# Patient Record
Sex: Female | Born: 2000
Health system: Southern US, Community
[De-identification: ages and names within clinical notes are randomized; demographics above are authoritative.]

## PROBLEM LIST (undated history)

## (undated) ENCOUNTER — Inpatient Hospital Stay (AMBULATORY_SURGERY_CENTER): Payer: 59 | Admitting: Podiatry

## (undated) DIAGNOSIS — G43109 Migraine with aura, not intractable, without status migrainosus: Secondary | ICD-10-CM

## (undated) DIAGNOSIS — T7840XA Allergy, unspecified, initial encounter: Secondary | ICD-10-CM

## (undated) DIAGNOSIS — D649 Anemia, unspecified: Secondary | ICD-10-CM

## (undated) DIAGNOSIS — N920 Excessive and frequent menstruation with regular cycle: Secondary | ICD-10-CM

## (undated) HISTORY — DX: Excessive and frequent menstruation with regular cycle: N92.0

## (undated) HISTORY — DX: Migraine with aura, not intractable, without status migrainosus: G43.109

## (undated) HISTORY — DX: Allergy, unspecified, initial encounter: T78.40XA

## (undated) HISTORY — DX: Anemia, unspecified: D64.9

---
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
## 2021-06-21 DIAGNOSIS — F419 Anxiety disorder, unspecified: Secondary | ICD-10-CM | POA: Diagnosis not present

## 2021-06-21 DIAGNOSIS — N946 Dysmenorrhea, unspecified: Secondary | ICD-10-CM | POA: Diagnosis not present

## 2021-08-09 DIAGNOSIS — Z68.41 Body mass index (BMI) pediatric, 5th percentile to less than 85th percentile for age: Secondary | ICD-10-CM | POA: Diagnosis not present

## 2021-08-09 DIAGNOSIS — Z01419 Encounter for gynecological examination (general) (routine) without abnormal findings: Secondary | ICD-10-CM | POA: Diagnosis not present

## 2021-12-13 NOTE — Progress Notes (Signed)
?FOLLOW UP ? ?Assessment and Plan:  ? ?Jody Wyatt was seen today for CPE ? ?Diagnoses and all orders for this visit: ? ?Encounter for Annual Physical Exam with abnormal findings ?Due annually  ?Health Maintenance reviewed ?Healthy lifestyle reviewed and goals set ? ?Health Maintenance- Discussed STD testing, safe sex, alcohol and drug awareness, drinking and driving dangers, wearing a seat belt and general safety measures for young adult ? ?History of menorrhagia ?Recently off of HBC; monitor ?NSAID 1-2 days prior to cycle ? ?Iron deficiency anemia, unspecified iron deficiency anemia type ?-     CBC with Differential/Platelet ?-     Iron/TIBC/Ferritin - defer, add on if needed ? ?Family history of celiac disease ?Monitor; encouraged low gluten diet due to possible sensitivity ?Check celiac panels occasionally; discussed gold standard is colonoscopy with biopsy if progressive concerning sx; avoid trigger foods ? - recheck panel today per patient preference  ? ?BMI 26  ?Discussion about weight loss, diet, and exercise ?Recommended diet heavy in fruits and veggies and low in animal meats, cheeses, and dairy products, appropriate calorie intake ?Discussed appropriate weight for height  ?Follow up at next visit ?May be due to muscle mass; suggested monitor fat % if aggressively working out ? ?Need for tetanus booster ?- Td administered without complication.  ? ?Elevated LFTs ?- was trending up last year, never returned for recheck ?- denies tylenol, alcohol, family hx ?- recheck LFTs, plan hepatitis/autoimmune workup and liver US for elevations without clear etiology ?- following low processed carb diet ? ?Migraines ?Failed ibuprofen 800 mg; discussed and given imitrex abortive to try, follow up if lack of benefit ?Discussed migraine triggers, encouraged HA log, handout given ?Check magnesium  ?Lifestyle discussed: diet/exerise, sleep hygiene, stress management, hydration ? ?Vitamin D def ?Never started supplement; defer  recheck this year ?Advised to start 2000 IU daily, consider combo with K2 ? ?Orders Placed This Encounter  ?Procedures  ? Td : Tetanus/diphtheria >7yo Preservative  Wyatt  ? CBC with Differential/Platelet  ? COMPLETE METABOLIC PANEL WITH GFR  ? Magnesium  ? Insulin, random  ? Urinalysis, Routine w reflex microscopic  ? Celiac Disease Comprehensive Panel with Reflexes  ? ? ? ?Continue diet and meds as discussed. Further disposition pending results of labs. Discussed med's effects and SE's.   ?Over 30 minutes of exam, counseling, chart review, and critical decision making was performed.  ? ?Future Appointments  ?Date Time Provider [place]  ?12/18/2022  9:00 AM Darrol Jump, NP [facility] None  ? ? ?---------------------------------------------------------------------------------------------------------------------- ? ?HPI ?BP 102/70   Pulse 88   Temp 97.7 ?F (36.5 ?C)   Ht 5' 3.25" (1.607 m)   Wt 150 lb 9.6 oz (68.3 kg)   SpO2 99%   BMI 26.47 kg/m?  ? ?21 y.o. female  presents for CPE. She has Family history of celiac disease; Iron deficiency anemia; BMI 25.0-25.9,adult; History of menorrhagia; Vitamin D deficiency; Elevated LFTs; and Migraine without aura and without status migrainosus, not intractable on their problem list.  ? 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 Living at home with parents. Has boyfriend of 2 years, not sexually active, he is supportive. Denies STD testing.  ? ?She was seen by GYN Jody Chad, NP Jody Wyatt) had workup in 2020 with iron def anemia, hx of menorrhagia, was given iron supplement, menorrhagia improved on HBC, has since tapered off and does report increased cramping, but managing with ibuprofen 800 mg PRN. ? ?She reports having occasional migraines, 2 per year, unknown  trigger, typically on L with photosensitivity. Doesn't respond to ibuprofen 800 mg.  ? ?Notably mom has Celiac's, no gluten at mom's house, but else where may eat some, has noted abdominal pain with  excess gluten, also some with dairy. Reports has a negative celiac panel x 2 in the past. Requests recheck today, having more HA and bloating.  ? ?BMI is Body mass index is 26.47 kg/m?., she has been working on diet and exercise. She is using pelaton 4-5 days a week and strength classes 3 days a week. Eats "clean," meat twice a week (chicken), mostly plant based, less processed.  ?1 cup coffee daily, occasionally a cold brew  ?Water intake 60-90+ fluid ounces of water ?No sugary drinks ?Wt Readings from Last 3 Encounters:  ?12/17/21 150 lb 9.6 oz (68.3 kg) (81 %, Z= 0.86)*  ?12/17/20 149 lb (67.6 kg) (82 %, Z= 0.90)*  ?06/13/20 143 lb 6.4 oz (65 kg) (78 %, Z= 0.77)*  ? ?* Growth percentiles are based on CDC (Girls, 2-20 Years) data.  ? ?She takes iron supplement with menstrual cycle only, hx of menorrhagia.  ? ?Lab Results  ?Component Value Date  ? WBC 4.5 12/17/2020  ? HGB 13.2 12/17/2020  ? HCT 41.3 12/17/2020  ? MCV 88.4 12/17/2020  ? PLT 321 12/17/2020  ? ?Lab Results  ?Component Value Date  ? IRON 97 12/17/2020  ? TIBC 313 12/17/2020  ? FERRITIN 22 12/17/2020  ? ?Today their BP is BP: 102/70 ? She does workout. She denies chest pain, shortness of breath, dizziness. ? ?She admits hasn't started on supplement.  ?Lab Results  ?Component Value Date  ? VD25OH 23 (L) 12/17/2020  ?   ?She had mildly elevated LFTs, trended up from previous, never returned for recheck. Denies excess tylenol use, denies alcohol intake, denies family hx of liver dz.  ?Lab Results  ?Component Value Date  ? ALT 50 (H) 12/17/2020  ? AST 36 (H) 12/17/2020  ? ALKPHOS 53 08/31/2017  ? BILITOT 1.0 12/17/2020  ? ? ? ? ?Current Medications:  ?Current Outpatient Medications on File Prior to Visit  ?Medication Sig  ? busPIRone (BUSPAR) 5 MG tablet Take 5 mg by mouth as needed.  ? CHOLECALCIFEROL PO Take 2,000 Units by mouth daily.  ? Ferrous Sulfate (IRON PO) Take by mouth daily.  ? ibuprofen (ADVIL,MOTRIN) 200 MG tablet Take 400 mg by mouth every 6  (six) hours as needed (foot pain).  ? ?No current facility-administered medications on file prior to visit.  ? ? ? ? ?Immunization History  ?Administered Date(s) Administered  ? DTaP 04/13/2002, 06/10/2002, 08/11/2002, 05/18/2003, 02/12/2007  ? Hepatitis A 03/19/2006, 02/12/2007  ? Hepatitis B 2001/06[birth year], 03/15/2002, 11/09/2002  ? IPV 04/13/2002, 06/10/2002, 11/09/2002, 02/12/2007  ? Influenza Inj Mdck Quad With Preservative 06/13/2020  ? MMR 02/14/2003, 02/12/2007  ? MenQuadfi_Meningococcal Groups ACYW Conjugate 03/18/2013, 05/11/2018  ? PFIZER Comirnaty(Gray Top)Covid-19 Tri-Sucrose Vaccine 01/11/2020, 02/01/2020  ? Pneumococcal Conjugate-13 04/13/2002, 06/10/2002, 08/11/2002, 05/18/2003  ? Td 12/17/2021  ? Tdap 03/17/2012  ? Varicella 05/18/2003, 02/12/2007  ? ?Health Maintenance  ?Topic Date Due  ? HPV VACCINES (1 - 2-dose series) Never done  ? HIV Screening  12/17/2021 (Originally 02/08/2017)  ? COVID-19 Vaccine (3 - Pfizer risk series) 01/02/2022 (Originally 02/29/2020)  ? INFLUENZA VACCINE  03/25/2022  ? TETANUS/TDAP  12/18/2031  ? Hepatitis C Screening  Discontinued  ? ?Tetanus: 2013 TODAY  ?Covid 19: 2/2, pfizer, 2021 - had asymptomatic case 08/2020 ? ?HPV vaccines -  had  as a teen, records reqeust  ? ?Last PAP: by GYN ? 2020, negative, recommend restart age 39 ?Mammogram: never,  ? ?Last eye: denies concerns, never ?Last dental: goes q31m last 2023 ?Last derm: plans to schedule ? ? ? ?Allergies:  ?Allergies  ?Allergen Reactions  ? Augmentin [Amoxicillin-Pot Clavulanate] Rash  ? Percocet [Oxycodone-Acetaminophen] Rash  ?  ?Medical History:  ?has Family history of celiac disease; Iron deficiency anemia; BMI 25.0-25.9,adult; History of menorrhagia; Vitamin D deficiency; Elevated LFTs; and Migraine without aura and without status migrainosus, not intractable on their problem list. ?Surgical History:  ?She  has a past surgical history that includes lapidus bunionectomy (Right, 08/19/2017) and Wisdom tooth  extraction (2020). ?Family History:  ?Herfamily history includes Alcoholism in her father, maternal grandfather, and paternal grandfather; Celiac disease in her mother; Heart disease in her maternal grandfather and

## 2021-12-17 ENCOUNTER — Ambulatory Visit (INDEPENDENT_AMBULATORY_CARE_PROVIDER_SITE_OTHER): Payer: BC Managed Care – PPO | Admitting: Adult Health

## 2021-12-17 ENCOUNTER — Encounter: Payer: Self-pay | Admitting: Adult Health

## 2021-12-17 VITALS — BP 102/70 | HR 88 | Temp 97.7°F | Ht 63.25 in | Wt 150.6 lb

## 2021-12-17 DIAGNOSIS — Z0001 Encounter for general adult medical examination with abnormal findings: Secondary | ICD-10-CM

## 2021-12-17 DIAGNOSIS — R140 Abdominal distension (gaseous): Secondary | ICD-10-CM

## 2021-12-17 DIAGNOSIS — Z131 Encounter for screening for diabetes mellitus: Secondary | ICD-10-CM | POA: Diagnosis not present

## 2021-12-17 DIAGNOSIS — E663 Overweight: Secondary | ICD-10-CM

## 2021-12-17 DIAGNOSIS — Z13228 Encounter for screening for other metabolic disorders: Secondary | ICD-10-CM

## 2021-12-17 DIAGNOSIS — Z8742 Personal history of other diseases of the female genital tract: Secondary | ICD-10-CM

## 2021-12-17 DIAGNOSIS — E559 Vitamin D deficiency, unspecified: Secondary | ICD-10-CM

## 2021-12-17 DIAGNOSIS — Z13220 Encounter for screening for lipoid disorders: Secondary | ICD-10-CM | POA: Diagnosis not present

## 2021-12-17 DIAGNOSIS — Z79899 Other long term (current) drug therapy: Secondary | ICD-10-CM | POA: Diagnosis not present

## 2021-12-17 DIAGNOSIS — Z0000 Encounter for general adult medical examination without abnormal findings: Secondary | ICD-10-CM | POA: Diagnosis not present

## 2021-12-17 DIAGNOSIS — Z23 Encounter for immunization: Secondary | ICD-10-CM

## 2021-12-17 DIAGNOSIS — Z1389 Encounter for screening for other disorder: Secondary | ICD-10-CM | POA: Diagnosis not present

## 2021-12-17 DIAGNOSIS — Z8379 Family history of other diseases of the digestive system: Secondary | ICD-10-CM

## 2021-12-17 DIAGNOSIS — Z6825 Body mass index (BMI) 25.0-25.9, adult: Secondary | ICD-10-CM

## 2021-12-17 DIAGNOSIS — G43009 Migraine without aura, not intractable, without status migrainosus: Secondary | ICD-10-CM | POA: Insufficient documentation

## 2021-12-17 DIAGNOSIS — D509 Iron deficiency anemia, unspecified: Secondary | ICD-10-CM

## 2021-12-17 DIAGNOSIS — R7989 Other specified abnormal findings of blood chemistry: Secondary | ICD-10-CM

## 2021-12-17 MED ORDER — SUMATRIPTAN SUCCINATE 50 MG PO TABS: 50.0000 mg | ORAL_TABLET | Freq: Once | ORAL | 0 refills | Status: DC | PRN

## 2021-12-17 NOTE — Patient Instructions (Addendum)
?Jody Wyatt , ?Thank you for taking time to come for your Annual Wellness Visit. I appreciate your ongoing commitment to your health goals. Please review the following plan we discussed and let me know if I can assist you in the future.  ? ?This is a list of the screening recommended for you and due dates:  ?Health Maintenance  ?Topic Date Due  ? HPV Vaccine (1 - 2-dose series) Never done  ? HIV Screening  12/17/2021*  ? COVID-19 Vaccine (3 - Pfizer risk series) 01/02/2022*  ? Tetanus Vaccine  03/17/2022  ? Flu Shot  03/25/2022  ? Hepatitis C Screening: USPSTF Recommendation to screen - Ages 81-79 yo.  Discontinued  ?*Topic was postponed. The date shown is not the original due date.  ? ?Know what a healthy weight is for you (roughly BMI <25) and aim to maintain this ? ?Aim for 7+ servings of fruits and vegetables daily ? ?65-80+ fluid ounces of water or unsweet tea for healthy kidneys ? ?Limit to max 1 drink of alcohol per day; avoid smoking/tobacco ? ?Limit animal fats in diet for cholesterol and heart health - choose grass fed whenever available ? ?Avoid highly processed foods, and foods high in saturated/trans fats ? ?Aim for low stress - take time to unwind and care for your mental health ? ?Aim for 150 min of moderate intensity exercise weekly for heart health, and weights twice weekly for bone health ? ?Aim for 7-9 hours of sleep daily ? ? ? ? ? ?Migraine Headache ?A migraine headache is an intense, throbbing pain on one side or both sides of the head. Migraine headaches may also cause other symptoms, such as nausea, vomiting, and sensitivity to light and noise. A migraine headache can last from 4 hours to 3 days. Talk with your doctor about what things may bring on (trigger) your migraine headaches. ?What are the causes? ?The exact cause of this condition is not known. However, a migraine may be caused when nerves in the brain become irritated and release chemicals that cause inflammation of blood vessels.  This inflammation causes pain. This condition may be triggered or caused by: ?Drinking alcohol. ?Smoking. ?Taking medicines, such as: ?Medicine used to treat chest pain (nitroglycerin). ?Birth control pills. ?Estrogen. ?Certain blood pressure medicines. ?Eating or drinking products that contain nitrates, glutamate, aspartame, or tyramine. Aged cheeses, chocolate, or caffeine may also be triggers. ?Doing physical activity. ?Other things that may trigger a migraine headache include: ?Menstruation. ?Pregnancy. ?Hunger. ?Stress. ?Lack of sleep or too much sleep. ?Weather changes. ?Fatigue. ?What increases the risk? ?The following factors may make you more likely to experience migraine headaches: ?Being a certain age. This condition is more common in people who are 19-85 years old. ?Being female. ?Having a family history of migraine headaches. ?Being Caucasian. ?Having a mental health condition, such as depression or anxiety. ?Being obese. ?What are the signs or symptoms? ?The main symptom of this condition is pulsating or throbbing pain. This pain may: ?Happen in any area of the head, such as on one side or both sides. ?Interfere with daily activities. ?Get worse with physical activity. ?Get worse with exposure to bright lights or loud noises. ?Other symptoms may include: ?Nausea. ?Vomiting. ?Dizziness. ?General sensitivity to bright lights, loud noises, or smells. ?Before you get a migraine headache, you may get warning signs (an aura). An aura may include: ?Seeing flashing lights or having blind spots. ?Seeing bright spots, halos, or zigzag lines. ?Having tunnel vision or blurred vision. ?Having  numbness or a tingling feeling. ?Having trouble talking. ?Having muscle weakness. ?Some people have symptoms after a migraine headache (postdromal phase), such as: ?Feeling tired. ?Difficulty concentrating. ?How is this diagnosed? ?A migraine headache can be diagnosed based on: ?Your symptoms. ?A physical exam. ?Tests, such  as: ?CT scan or an MRI of the head. These imaging tests can help rule out other causes of headaches. ?Taking fluid from the spine (lumbar puncture) and analyzing it (cerebrospinal fluid analysis, or CSF analysis). ?How is this treated? ?This condition may be treated with medicines that: ?Relieve pain. ?Relieve nausea. ?Prevent migraine headaches. ?Treatment for this condition may also include: ?Acupuncture. ?Lifestyle changes like avoiding foods that trigger migraine headaches. ?Biofeedback. ?Cognitive behavioral therapy. ?Follow these instructions at home: ?Medicines ?Take over-the-counter and prescription medicines only as told by your health care provider. ?Ask your health care provider if the medicine prescribed to you: ?Requires you to avoid driving or using heavy machinery. ?Can cause constipation. You may need to take these actions to prevent or treat constipation: ?Drink enough fluid to keep your urine pale yellow. ?Take over-the-counter or prescription medicines. ?Eat foods that are high in fiber, such as beans, whole grains, and fresh fruits and vegetables. ?Limit foods that are high in fat and processed sugars, such as fried or sweet foods. ?Lifestyle ?Do not drink alcohol. ?Do not use any products that contain nicotine or tobacco, such as cigarettes, e-cigarettes, and chewing tobacco. If you need help quitting, ask your health care provider. ?Get at least 8 hours of sleep every night. ?Find ways to manage stress, such as meditation, deep breathing, or yoga. ?General instructions ? ?  ? ?Keep a journal to find out what may trigger your migraine headaches. For example, write down: ?What you eat and drink. ?How much sleep you get. ?Any change to your diet or medicines. ?If you have a migraine headache: ?Avoid things that make your symptoms worse, such as bright lights. ?It may help to lie down in a dark, quiet room. ?Do not drive or use heavy machinery. ?Ask your health care provider what activities are  safe for you while you are experiencing symptoms. ?Keep all follow-up visits as told by your health care provider. This is important. ?Contact a health care provider if: ?You develop symptoms that are different or more severe than your usual migraine headache symptoms. ?You have more than 15 headache days in one month. ?Get help right away if: ?Your migraine headache becomes severe. ?Your migraine headache lasts longer than 72 hours. ?You have a fever. ?You have a stiff neck. ?You have vision loss. ?Your muscles feel weak or like you cannot control them. ?You start to lose your balance often. ?You have trouble walking. ?You faint. ?You have a seizure. ?Summary ?A migraine headache is an intense, throbbing pain on one side or both sides of the head. Migraines may also cause other symptoms, such as nausea, vomiting, and sensitivity to light and noise. ?This condition may be treated with medicines and lifestyle changes. You may also need to avoid certain things that trigger a migraine headache. ?Keep a journal to find out what may trigger your migraine headaches. ?Contact your health care provider if you have more than 15 headache days in a month or you develop symptoms that are different or more severe than your usual migraine headache symptoms. ?This information is not intended to replace advice given to you by your health care provider. Make sure you discuss any questions you have with your health care  provider. ?Document Revised: 12/03/2018 Document Reviewed: 09/23/2018 ?Elsevier Patient Education ? 2023 Else[employer] Inc. ? ? ? ? ? ?Sumatriptan Tablets ?What is this medication? ?SUMATRIPTAN (soo ma TRIP tan) treats migraines. It works by blocking pain signals and narrowing blood vessels in the brain. It belongs to a group of medications called triptans. It is not used to prevent migraines. ?This medicine may be used for other purposes; ask your health care provider or pharmacist if you have questions. ?COMMON BRAND  NAME(S): Imitrex, Migraine Pack ?What should I tell my care team before I take this medication? ?They need to know if you have any of these conditions: ?Cigarette smoker ?Circulation problems in fingers and toes

## 2021-12-18 LAB — CBC WITH DIFFERENTIAL/PLATELET
Absolute Monocytes: 353 cells/uL (ref 200–950)
Basophils Absolute: 20 cells/uL (ref 0–200)
Basophils Relative: 0.4 %
Eosinophils Absolute: 39 cells/uL (ref 15–500)
Eosinophils Relative: 0.8 %
HCT: 42.9 % (ref 35.0–45.0)
Hemoglobin: 13.9 g/dL (ref 11.7–15.5)
Lymphs Abs: 1318 cells/uL (ref 850–3900)
MCH: 29.1 pg (ref 27.0–33.0)
MCHC: 32.4 g/dL (ref 32.0–36.0)
MCV: 89.7 fL (ref 80.0–100.0)
MPV: 11.4 fL (ref 7.5–12.5)
Monocytes Relative: 7.2 %
Neutro Abs: 3170 cells/uL (ref 1500–7800)
Neutrophils Relative %: 64.7 %
Platelets: 332 10*3/uL (ref 140–400)
RBC: 4.78 10*6/uL (ref 3.80–5.10)
RDW: 11.9 % (ref 11.0–15.0)
Total Lymphocyte: 26.9 %
WBC: 4.9 10*3/uL (ref 3.8–10.8)

## 2021-12-18 LAB — INSULIN, RANDOM: Insulin: 15.5 u[IU]/mL

## 2021-12-18 LAB — URINALYSIS, ROUTINE W REFLEX MICROSCOPIC
Bilirubin Urine: NEGATIVE
Glucose, UA: NEGATIVE
Hgb urine dipstick: NEGATIVE
Leukocytes,Ua: NEGATIVE
Nitrite: NEGATIVE
Protein, ur: NEGATIVE
Specific Gravity, Urine: 1.031 (ref 1.001–1.035)
pH: 6 (ref 5.0–8.0)

## 2021-12-18 LAB — COMPLETE METABOLIC PANEL WITH GFR
AG Ratio: 1.7 (calc) (ref 1.0–2.5)
ALT: 21 U/L (ref 5–32)
AST: 16 U/L (ref 12–32)
Albumin: 5 g/dL (ref 3.6–5.1)
Alkaline phosphatase (APISO): 46 U/L (ref 36–128)
BUN: 13 mg/dL (ref 7–20)
CO2: 28 mmol/L (ref 20–32)
Calcium: 10.4 mg/dL (ref 8.9–10.4)
Chloride: 102 mmol/L (ref 98–110)
Creat: 0.66 mg/dL (ref 0.50–0.96)
Globulin: 2.9 g/dL (calc) (ref 2.0–3.8)
Glucose, Bld: 71 mg/dL (ref 65–99)
Potassium: 4.8 mmol/L (ref 3.8–5.1)
Sodium: 139 mmol/L (ref 135–146)
Total Bilirubin: 1 mg/dL (ref 0.2–1.1)
Total Protein: 7.9 g/dL (ref 6.3–8.2)
eGFR: 130 mL/min/{1.73_m2} (ref >=60)

## 2021-12-18 LAB — MAGNESIUM: Magnesium: 2.1 mg/dL (ref 1.5–2.5)

## 2021-12-18 LAB — CELIAC DISEASE COMPREHENSIVE PANEL WITH REFLEXES
(tTG) Ab, IgA: <1 U/mL
Immunoglobulin A: 233 mg/dL (ref 47–310)

## 2021-12-30 DIAGNOSIS — L723 Sebaceous cyst: Secondary | ICD-10-CM | POA: Diagnosis not present

## 2021-12-30 DIAGNOSIS — L700 Acne vulgaris: Secondary | ICD-10-CM | POA: Diagnosis not present

## 2022-01-13 ENCOUNTER — Other Ambulatory Visit: Payer: Self-pay | Admitting: Adult Health

## 2022-01-13 DIAGNOSIS — G43009 Migraine without aura, not intractable, without status migrainosus: Secondary | ICD-10-CM

## 2022-01-14 ENCOUNTER — Ambulatory Visit (INDEPENDENT_AMBULATORY_CARE_PROVIDER_SITE_OTHER): Payer: BC Managed Care – PPO | Admitting: Plastic Surgery

## 2022-01-14 ENCOUNTER — Encounter: Payer: Self-pay | Admitting: Plastic Surgery

## 2022-01-14 DIAGNOSIS — L723 Sebaceous cyst: Secondary | ICD-10-CM | POA: Diagnosis not present

## 2022-01-14 NOTE — Progress Notes (Signed)
     Patient ID: Jody Wyatt, female    DOB: 01-02-2001, 21 y.o.   MRN: [id number]   Chief Complaint  Patient presents with   Consult        Skin Problem    The patient is a 21 year old female here for evaluation of a lesion on her face.  Patient states that that has been there for several months.  It seems to be getting a little bit larger.  She has been really good about not messing with it so that it does not get red and irritated.  It is slightly firm and about 6 mm and has the consistency of a sebaceous cyst.  She is otherwise in good health.    Review of Systems  Constitutional: Negative.   Eyes: Negative.   Respiratory: Negative.    Cardiovascular: Negative.   Gastrointestinal: Negative.   Endocrine: Negative.   Genitourinary: Negative.   Musculoskeletal: Negative.   Skin:  Negative for rash and wound.   Past Medical History:  Diagnosis Date   Menorrhagia     Past Surgical History:  Procedure Laterality Date   lapidus bunionectomy Right 08/19/2017   WISDOM TOOTH EXTRACTION  2020      Current Outpatient Medications:    busPIRone (BUSPAR) 5 MG tablet, Take 5 mg by mouth as needed., Disp: , Rfl:    CHOLECALCIFEROL PO, Take 2,000 Units by mouth daily., Disp: , Rfl:    Ferrous Sulfate (IRON PO), Take by mouth daily., Disp: , Rfl:    ibuprofen (ADVIL,MOTRIN) 200 MG tablet, Take 400 mg by mouth every 6 (six) hours as needed (foot pain)., Disp: , Rfl:    SUMAtriptan (IMITREX) 50 MG tablet, TAKE 1 TAB BY MOUTH AS NEEDED FOR MIGRAINE, MAY REPEAT IN 2 HOURS IF PERSISTS, Disp: 10 tablet, Rfl: 0   Objective:   Vitals:   01/14/22 1511  BP: 109/67  Pulse: 65  SpO2: 98%    Physical Exam Constitutional:      Appearance: Normal appearance.  HENT:     Head:   Cardiovascular:     Rate and Rhythm: Normal rate.     Pulses: Normal pulses.  Pulmonary:     Effort: Pulmonary effort is normal.  Skin:    General: Skin is warm.     Capillary Refill: Capillary refill  takes less than 2 seconds.     Coloration: Skin is not jaundiced.     Findings: Lesion present. No bruising.  Neurological:     Mental Status: She is alert and oriented to person, place, and time.  Psychiatric:        Mood and Affect: Mood normal.        Behavior: Behavior normal.    Assessment & Plan:  Sebaceous cyst  Plan excision of sebaceous cyst chin  Loel Lofty Kimoni Pagliarulo, DO

## 2022-01-29 DIAGNOSIS — J019 Acute sinusitis, unspecified: Secondary | ICD-10-CM | POA: Diagnosis not present

## 2022-03-13 ENCOUNTER — Ambulatory Visit: Payer: Self-pay | Admitting: Podiatry

## 2022-03-28 ENCOUNTER — Ambulatory Visit (INDEPENDENT_AMBULATORY_CARE_PROVIDER_SITE_OTHER): Payer: BC Managed Care – PPO | Admitting: Plastic Surgery

## 2022-03-28 ENCOUNTER — Encounter: Payer: Self-pay | Admitting: Plastic Surgery

## 2022-03-28 VITALS — BP 116/79 | HR 73

## 2022-03-28 DIAGNOSIS — L723 Sebaceous cyst: Secondary | ICD-10-CM

## 2022-03-28 NOTE — Progress Notes (Signed)
Procedure Note  Preoperative Dx: sebaceous cyst of chin  Postoperative Dx: Same  Procedure: Excision of sebaceous cyst of chin 5 mm  Anesthesia: Lidocaine 1% with 1:100,000 epinephrine  Indication for Procedure: cyst  Description of Procedure: Risks and complications were explained to the patient.  Consent was confirmed and the patient understands the risks and benefits.  The potential complications and alternatives were explained and the patient consents.  The patient expressed understanding the option of not having the procedure and the risks of a scar.  Time out was called and all information was confirmed to be correct.    The area was prepped and drapped.  Lidocaine 1% with epinepherine was injected in the subcutaneous area.  After waiting several minutes for the local to take affect a #15 blade was used to incise the skin over the area.  The #15 blade was used to excise the entire sebaceous cyst and sac. The skin edges were reapproximated with 6-0 Monocryl.  A dressing was applied.  The patient was given instructions on how to care for the area and a follow up appointment.  Joelene tolerated the procedure well and there were no complications. Agreed that path not needed.

## 2022-04-07 ENCOUNTER — Ambulatory Visit (INDEPENDENT_AMBULATORY_CARE_PROVIDER_SITE_OTHER): Payer: BC Managed Care – PPO | Admitting: Physician Assistant

## 2022-04-07 DIAGNOSIS — L989 Disorder of the skin and subcutaneous tissue, unspecified: Secondary | ICD-10-CM

## 2022-04-07 NOTE — Progress Notes (Signed)
Patient is a 21 year old female with PMH of facial lesion s/p excision performed 03/28/2022 by Dr. Ulice Bold presents to clinic for postprocedural follow-up.  Reviewed procedural note and the lesion was excised and then reapproximated and closed with 6-0 Monocryl.  No complications.  The specimen was not sent for pathology, but had a consistency of a sebaceous cyst.  Today, patient is doing well.  She states that she replaced the Steri-Strip at home after the one that was initially placed started to fall off and was permeated with bloody drainage.  Otherwise, no complaints.  Steri-Strip is removed and the excision site appears to be well approximated.  Two Monocryl skin stitches are removed without complication or difficulty.  No bleeding provoked.  No surrounding skin changes or concern for cellulitis.  She states that her sister had a lesion excised and subsequently purchased a silicone scar gel from this clinic.  Patient plans to use that scar gel for her scar mitigation.  Recommend twice daily x3 months.  No specific follow-up needed, but she can certainly call the clinic should she have any questions or concerns.

## 2022-04-14 DIAGNOSIS — L700 Acne vulgaris: Secondary | ICD-10-CM | POA: Diagnosis not present

## 2022-07-15 DIAGNOSIS — L700 Acne vulgaris: Secondary | ICD-10-CM | POA: Diagnosis not present

## 2022-11-23 ENCOUNTER — Emergency Department (HOSPITAL_BASED_OUTPATIENT_CLINIC_OR_DEPARTMENT_OTHER): Payer: BC Managed Care – PPO | Admitting: Radiology

## 2022-11-23 ENCOUNTER — Encounter (HOSPITAL_BASED_OUTPATIENT_CLINIC_OR_DEPARTMENT_OTHER): Payer: Self-pay

## 2022-11-23 ENCOUNTER — Emergency Department (HOSPITAL_BASED_OUTPATIENT_CLINIC_OR_DEPARTMENT_OTHER)
Admission: EM | Admit: 2022-11-23 | Discharge: 2022-11-23 | Disposition: A | Discharge status: Home / Self Care | Payer: BC Managed Care – PPO | Attending: Emergency Medicine | Admitting: Emergency Medicine

## 2022-11-23 DIAGNOSIS — Y9301 Activity, walking, marching and hiking: Secondary | ICD-10-CM | POA: Diagnosis not present

## 2022-11-23 DIAGNOSIS — X500XXA Overexertion from strenuous movement or load, initial encounter: Secondary | ICD-10-CM | POA: Diagnosis not present

## 2022-11-23 DIAGNOSIS — S92352A Displaced fracture of fifth metatarsal bone, left foot, initial encounter for closed fracture: Secondary | ICD-10-CM | POA: Diagnosis not present

## 2022-11-23 DIAGNOSIS — S99922A Unspecified injury of left foot, initial encounter: Secondary | ICD-10-CM | POA: Diagnosis not present

## 2022-11-23 NOTE — ED Provider Notes (Signed)
[facility] Provider Note   CSN: [id number] Arrival date & time: 11/23/22  [id number]     History  Chief Complaint  Patient presents with   Foot Injury    Jody Wyatt is a 22 y.o. female.   Foot Injury   22 year old female presents emergency department with complaints of left foot injury.  Patient states that she was attempting to walk her dog when she stepped on an object in the yard rolling her left foot.  She states that initially close pain and heard a "pop" but was able to weight-bear thereafter.  States he woke up this morning and the swelling and pain was more severe and she is unable to weight-bear prompting visit to the emergency department.  Denies fall from injury or pain elsewhere.  Denies any weakness or sensory deficits in affected foot.  Patient with prior bunionectomy of left foot.  No significant pertinent past medical history. Home Medications Prior to Admission medications   Medication Sig Start Date End Date Taking? Authorizing Provider  busPIRone (BUSPAR) 5 MG tablet Take 5 mg by mouth as needed.    [provider]  CHOLECALCIFEROL PO Take 2,000 Units by mouth daily.    [provider]  Ferrous Sulfate (IRON PO) Take by mouth daily.    [provider]  ibuprofen (ADVIL,MOTRIN) 200 MG tablet Take 400 mg by mouth every 6 (six) hours as needed (foot pain).    [provider]  SUMAtriptan (IMITREX) 50 MG tablet TAKE 1 TAB BY MOUTH AS NEEDED FOR MIGRAINE, MAY REPEAT IN 2 HOURS IF PERSISTS 01/13/22   Alycia Rossetti, NP      Allergies    Augmentin [amoxicillin-pot clavulanate] and Percocet [oxycodone-acetaminophen]    Review of Systems   Review of Systems  All other systems reviewed and are negative.   Physical Exam Updated Vital Signs BP 115/74 (BP Location: Right Arm)   Pulse 64   Temp 98.5 F (36.9 C) (Oral)   Resp 14   LMP 11/16/2022 (Exact Date)   SpO2 100%   Physical Exam Vitals and nursing note reviewed.  Constitutional:      General: She is not in acute distress.    Appearance: She is well-developed.  HENT:     Head: Normocephalic and atraumatic.  Eyes:     Conjunctiva/sclera: Conjunctivae normal.  Cardiovascular:     Rate and Rhythm: Normal rate and regular rhythm.     Heart sounds: No murmur heard. Pulmonary:     Effort: Pulmonary effort is normal. No respiratory distress.     Breath sounds: Normal breath sounds.  Abdominal:     Palpations: Abdomen is soft.     Tenderness: There is no abdominal tenderness.  Musculoskeletal:        General: No swelling.     Cervical back: Neck supple.     Comments: Patient with full range of motion of bilateral hips, knees, ankles, digits.  Pulses 2+ bilaterally.  Patient with swelling appreciated over left lateral base of fifth metatarsal with tenderness to palpation.  No obvious breaks in skin appreciated.  Skin:    General: Skin is warm and dry.     Capillary Refill: Capillary refill takes less than 2 seconds.  Neurological:     Mental Status: She is alert.  Psychiatric:        Mood and Affect: Mood normal.     ED Results / Procedures / Treatments   Labs (all labs  ordered are listed, but only abnormal results are displayed) Labs Reviewed - No data to display  EKG None  Radiology DG Foot Complete Left  Result Date: 11/23/2022 CLINICAL DATA:  Status post fall while at church today. EXAM: LEFT FOOT - COMPLETE 3+ VIEW COMPARISON:  12/31/2017 FINDINGS: There is an acute fracture involving the base of the fifth metatarsal bone. There is approximately 3 mm of distraction of the fracture fragments. No additional acute fracture or dislocation. Status post fusion of the base of the first metatarsal bone with the medial cuneiform bone. Hallux valgus deformity identified. IMPRESSION: 1. Acute fracture involves the base of the fifth metatarsal bone with 3 mm of distraction of the fracture fragments.  2. Hallux valgus deformity. Electronically Signed   By: Kerby Moors M.D.   On: 11/23/2022 09:23    Procedures Procedures    Medications Ordered in ED Medications - No data to display  ED Course/ Medical Decision Making/ A&P Clinical Course as of 11/23/22 1030  Sun Nov 23, 2022  0927 Patient states she has cam walker boot as well as crutches at home and declined said therapies while in the emergency department [CR]    Clinical Course User Index [CR] Wilnette Kales, PA                             Medical Decision Making Amount and/or Complexity of Data Reviewed Radiology: ordered.   This patient presents to the ED for concern of left foot pain, this involves an extensive number of treatment options, and is a complaint that carries with it a high risk of complications and morbidity.  The differential diagnosis includes fracture, dislocation, strain/sprain, DVT, ischemia, cellulitis, erysipelas, necrotizing fasciitis, gout   Co morbidities that complicate the patient evaluation  See HPI   Additional history obtained:  Additional history obtained from EMR External records from outside source obtained and reviewed including hospital records   Lab Tests:  N/a   Imaging Studies ordered:  I ordered imaging studies including left foot x-ray I independently visualized and interpreted imaging which showed acute fracture of base of fifth metatarsal with 3 mm of distraction.  Hallux valgus deformity. I agree with the radiologist interpretation  Cardiac Monitoring: / EKG:  The patient was maintained on a cardiac monitor.  I personally viewed and interpreted the cardiac monitored which showed an underlying rhythm of: Sinus rhythm   Consultations Obtained:  N/a   Problem List / ED Course / Critical interventions / Medication management  Left fifth metatarsal fracture Reevaluation of the patient showed that the patient stayed the same I have reviewed the patients  home medicines and have made adjustments as needed   Social Determinants of Health:  Denies tobacco, illicit drug use.   Test / Admission - Considered:  Left foot pain Vitals signs within normal range and stable throughout visit. Imaging studies significant for: See above 22 year old female presents emergency department with complaints of left foot pain.  Patient with evidence of closed, displaced base of fifth left metatarsal fracture.  Patient neurovascularly intact.  Discussed care send fracture with cam walker boot and crutches but patient states she has both at home.  Patient educated regarding further symptomatic therapy with rest, ice, elevation and medical therapy in the form of an nonsteroidals.  Patient recommended nonweightbearing until follow-up with orthopedics for further assessment.  Treatment plan discussed at length with patient and she not pending was agreeable to said  plan. Worrisome signs and symptoms were discussed with the patient, and the patient acknowledged understanding to return to the ED if noticed. Patient was stable upon discharge.          Final Clinical Impression(s) / ED Diagnoses Final diagnoses:  Displaced fracture of fifth metatarsal bone, left foot, initial encounter for closed fracture    Rx / DC Orders ED Discharge Orders     None         Wilnette Kales, [place] 11/23/22 1030    [place], DO 11/23/22 1154

## 2022-11-23 NOTE — Discharge Instructions (Addendum)
Note the workup today was overall consistent with fracture of the fifth metatarsal of your left foot.  Regarding his fracture, where cam walker boot as well as nonweightbearing using crutches to help aid in getting around.  You may rest, ice, elevate affected foot as well as take medicine in the form of ibuprofen/Aleve for pain/inflammation.  Recommend calling orthopedics to set up an appointment at your earliest convenience.  Please do not hesitate to return to emergency department for worrisome signs and symptoms we discussed become apparent.

## 2022-11-23 NOTE — ED Notes (Signed)
Pt given discharge instructions. Opportunities given for questions. Pt verbalizes understanding. Verne Lanuza R, RN 

## 2022-11-23 NOTE — ED Triage Notes (Signed)
She reports twisting her left foot while walking to her car. She states her "high heel got caught on something". She c/o pain at lat. Left foot, at which she has some edema.

## 2022-11-25 DIAGNOSIS — S92355A Nondisplaced fracture of fifth metatarsal bone, left foot, initial encounter for closed fracture: Secondary | ICD-10-CM | POA: Diagnosis not present

## 2022-12-02 DIAGNOSIS — S92355D Nondisplaced fracture of fifth metatarsal bone, left foot, subsequent encounter for fracture with routine healing: Secondary | ICD-10-CM | POA: Diagnosis not present

## 2022-12-11 DIAGNOSIS — L700 Acne vulgaris: Secondary | ICD-10-CM | POA: Diagnosis not present

## 2022-12-18 ENCOUNTER — Encounter: Payer: BC Managed Care – PPO | Admitting: Nurse Practitioner

## 2022-12-19 DIAGNOSIS — S92355D Nondisplaced fracture of fifth metatarsal bone, left foot, subsequent encounter for fracture with routine healing: Secondary | ICD-10-CM | POA: Diagnosis not present

## 2023-01-05 DIAGNOSIS — S92355D Nondisplaced fracture of fifth metatarsal bone, left foot, subsequent encounter for fracture with routine healing: Secondary | ICD-10-CM | POA: Diagnosis not present

## 2023-01-13 ENCOUNTER — Encounter: Payer: BC Managed Care – PPO | Admitting: Nurse Practitioner

## 2023-01-27 DIAGNOSIS — S92355D Nondisplaced fracture of fifth metatarsal bone, left foot, subsequent encounter for fracture with routine healing: Secondary | ICD-10-CM | POA: Diagnosis not present

## 2023-03-10 ENCOUNTER — Ambulatory Visit (INDEPENDENT_AMBULATORY_CARE_PROVIDER_SITE_OTHER): Payer: BC Managed Care – PPO | Admitting: Nurse Practitioner

## 2023-03-10 ENCOUNTER — Encounter: Payer: Self-pay | Admitting: Nurse Practitioner

## 2023-03-10 VITALS — BP 110/80 | HR 66 | Temp 98.3°F | Ht 63.25 in | Wt 166.6 lb

## 2023-03-10 DIAGNOSIS — Z6825 Body mass index (BMI) 25.0-25.9, adult: Secondary | ICD-10-CM

## 2023-03-10 DIAGNOSIS — Z0000 Encounter for general adult medical examination without abnormal findings: Secondary | ICD-10-CM | POA: Diagnosis not present

## 2023-03-10 DIAGNOSIS — Z130 Encounter for screening for diseases of the blood and blood-forming organs and certain disorders involving the immune mechanism: Secondary | ICD-10-CM | POA: Diagnosis not present

## 2023-03-10 DIAGNOSIS — Z0001 Encounter for general adult medical examination with abnormal findings: Secondary | ICD-10-CM

## 2023-03-10 DIAGNOSIS — R7989 Other specified abnormal findings of blood chemistry: Secondary | ICD-10-CM

## 2023-03-10 DIAGNOSIS — Z23 Encounter for immunization: Secondary | ICD-10-CM | POA: Diagnosis not present

## 2023-03-10 DIAGNOSIS — Z8379 Family history of other diseases of the digestive system: Secondary | ICD-10-CM

## 2023-03-10 DIAGNOSIS — Z1329 Encounter for screening for other suspected endocrine disorder: Secondary | ICD-10-CM

## 2023-03-10 DIAGNOSIS — Z131 Encounter for screening for diabetes mellitus: Secondary | ICD-10-CM

## 2023-03-10 DIAGNOSIS — E559 Vitamin D deficiency, unspecified: Secondary | ICD-10-CM

## 2023-03-10 DIAGNOSIS — G43009 Migraine without aura, not intractable, without status migrainosus: Secondary | ICD-10-CM

## 2023-03-10 DIAGNOSIS — D509 Iron deficiency anemia, unspecified: Secondary | ICD-10-CM

## 2023-03-10 DIAGNOSIS — Z111 Encounter for screening for respiratory tuberculosis: Secondary | ICD-10-CM

## 2023-03-10 DIAGNOSIS — Z13220 Encounter for screening for lipoid disorders: Secondary | ICD-10-CM | POA: Diagnosis not present

## 2023-03-10 DIAGNOSIS — Z8742 Personal history of other diseases of the female genital tract: Secondary | ICD-10-CM

## 2023-03-10 DIAGNOSIS — Z79899 Other long term (current) drug therapy: Secondary | ICD-10-CM

## 2023-03-10 DIAGNOSIS — Z1159 Encounter for screening for other viral diseases: Secondary | ICD-10-CM

## 2023-03-10 NOTE — Progress Notes (Unsigned)
FOLLOW UP  Assessment and Plan:   Jody Wyatt Wyatt was seen today for CPE  Diagnoses and all orders for this visit:  Encounter for Annual Physical Exam with abnormal findings Due annually  Health Maintenance reviewed Healthy lifestyle reviewed and goals set  Health Maintenance- Discussed STD testing, safe sex, alcohol and drug awareness, drinking and driving dangers, wearing a seat belt and general safety measures for young adult  History of menorrhagia Recently off of HBC; monitor NSAID 1-2 days prior to cycle  Iron deficiency anemia, unspecified iron deficiency anemia type -     CBC with Differential/Platelet -     Iron/TIBC/Ferritin - defer, add on if needed  Family history of celiac disease Monitor; encouraged low gluten diet due to possible sensitivity Check celiac panels occasionally; discussed gold standard is colonoscopy with biopsy if progressive concerning sx; avoid trigger foods  - recheck panel today per patient preference   BMI 26  Discussion about weight loss, diet, and exercise Recommended diet heavy in fruits and veggies and low in animal meats, cheeses, and dairy products, appropriate calorie intake Discussed appropriate weight for height  Follow up at next visit May be due to muscle mass; suggested monitor fat % if aggressively working out  Elevated LFTs - was trending up last year, never returned for recheck - denies tylenol, alcohol, family hx - recheck LFTs, plan hepatitis/autoimmune workup and liver US for elevations without clear etiology - following low processed carb diet  Migraines Failed ibuprofen 800 mg; discussed and given imitrex abortive to try, follow up if lack of benefit Discussed migraine triggers, encouraged HA log, handout given Check magnesium  Lifestyle discussed: diet/exerise, sleep hygiene, stress management, hydration  Vitamin D def Never started supplement; defer recheck this year Advised to start 2000 IU daily, consider combo with  K2    Continue diet and meds as discussed. Further disposition pending results of labs. Discussed med's effects and SE's.   Over 30 minutes of exam, counseling, chart review, and critical decision making was performed.   Future Appointments  Date Time Provider Department Center  03/09/2024  2:00 PM Jody Wyatt Wyatt, Jody Wyatt Patten, NP [facility] None    ----------------------------------------------------------------------------------------------------------------------  HPI BP 110/80   Pulse 66   Temp 98.3 F (36.8 C)   Ht 5' 3.25" (1.607 m)   Wt 166 lb 9.6 oz (75.6 kg)   SpO2 98%   BMI 29.28 kg/m   22 y.o. female  presents for CPE. She has Family history of celiac disease; Iron deficiency anemia; BMI 25.0-25.9,adult; History of menorrhagia; Vitamin D deficiency; Elevated LFTs; Migraine without aura and without status migrainosus, not intractable; and Sebaceous cyst on their problem list.   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 Living at home with parents. Has boyfriend of 2 years, not sexually active, he is supportive. Denies STD testing.   She was seen by GYN Jody Wyatt Pauls, NP Jody Wyatt Wyatt) had workup in 2020 with iron def anemia, hx of menorrhagia, was given iron supplement, menorrhagia improved on HBC, has since tapered off and does report increased cramping, but managing with ibuprofen 800 mg PRN.  She reports having occasional migraines, 2 per year, unknown trigger, typically on L with photosensitivity. Doesn't respond to ibuprofen 800 mg.   Notably mom has Celiac's, no gluten at mom's house, but else where may eat some, has noted abdominal pain with excess gluten, also some with dairy. Reports has a negative celiac panel x 2 in the past. Requests recheck today, having more HA and bloating.  BMI is Body mass index is 29.28 kg/m., she has been working on diet and exercise. She is using pelaton 4-5 days a week and strength classes 3 days a week. Eats "clean," meat twice a week (chicken), mostly  plant based, less processed.  1 cup coffee daily, occasionally a cold brew  Water intake 60-90+ fluid ounces of water No sugary drinks Wt Readings from Last 3 Encounters:  03/10/23 166 lb 9.6 oz (75.6 kg)  01/14/22 145 lb (65.8 kg) (75%, Z= 0.67)*  12/17/21 150 lb 9.6 oz (68.3 kg) (81%, Z= 0.86)*   * Growth percentiles are based on CDC (Girls, 2-20 Years) data.   She takes iron supplement with menstrual cycle only, hx of menorrhagia.   Lab Results  Component Value Date   WBC 4.9 12/17/2021   HGB 13.9 12/17/2021   HCT 42.9 12/17/2021   MCV 89.7 12/17/2021   PLT 332 12/17/2021   Lab Results  Component Value Date   IRON 97 12/17/2020   TIBC 313 12/17/2020   FERRITIN 22 12/17/2020   Today their BP is BP: 110/80  She does workout. She denies chest pain, shortness of breath, dizziness.  She admits hasn't started on supplement.  Lab Results  Component Value Date   VD25OH 23 (L) 12/17/2020     She had mildly elevated LFTs, trended up from previous, never returned for recheck. Denies excess tylenol use, denies alcohol intake, denies family hx of liver dz.  Lab Results  Component Value Date   ALT 21 12/17/2021   AST 16 12/17/2021   ALKPHOS 53 08/31/2017   BILITOT 1.0 12/17/2021      Current Medications:  Current Outpatient Medications on File Prior to Visit  Medication Sig  . Ferrous Sulfate (IRON PO) Take by mouth daily.  Marland Kitchen ibuprofen (ADVIL,MOTRIN) 200 MG tablet Take 400 mg by mouth every 6 (six) hours as needed (foot pain).  . busPIRone (BUSPAR) 5 MG tablet Take 5 mg by mouth as needed. (Patient not taking: Reported on 03/10/2023)  . CHOLECALCIFEROL PO Take 2,000 Units by mouth daily. (Patient not taking: Reported on 03/10/2023)  . SUMAtriptan (IMITREX) 50 MG tablet TAKE 1 TAB BY MOUTH AS NEEDED FOR MIGRAINE, MAY REPEAT IN 2 HOURS IF PERSISTS (Patient not taking: Reported on 03/10/2023)   No current facility-administered medications on file prior to visit.       Immunization History  Administered Date(s) Administered  . DTaP 04/13/2002, 06/10/2002, 08/11/2002, 05/18/2003, 02/12/2007  . Hepatitis A 03/19/2006, 02/12/2007  . Hepatitis B 28-Aug-2001, 03/15/2002, 11/09/2002  . IPV 04/13/2002, 06/10/2002, 11/09/2002, 02/12/2007  . Influenza Inj Mdck Quad With Preservative 06/13/2020  . MMR 02/14/2003, 02/12/2007  . MenQuadfi_Meningococcal Groups ACYW Conjugate 03/18/2013, 05/11/2018  . PFIZER Comirnaty(Gray Top)Covid-19 Tri-Sucrose Vaccine 01/11/2020, 02/01/2020  . Pneumococcal Conjugate-13 04/13/2002, 06/10/2002, 08/11/2002, 05/18/2003  . Td 12/17/2021  . Tdap 03/17/2012  . Varicella 05/18/2003, 02/12/2007   Health Maintenance  Topic Date Due  . HPV VACCINES (1 - 3-dose series) Never done  . HIV Screening  Never done  . COVID-19 Vaccine (3 - Pfizer risk series) 02/29/2020  . PAP-Cervical Cytology Screening  02/09/2023  . PAP SMEAR-Modifier  02/09/2023  . INFLUENZA VACCINE  03/26/2023  . DTaP/Tdap/Td (8 - Td or Tdap) 12/18/2031  . Hepatitis C Screening  Discontinued   Tetanus: 2013 TODAY  Covid 19: 2/2, pfizer, 2021 - had asymptomatic case 08/2020  HPV vaccines -  had as a teen, records reqeust   Last PAP: by GYN ? 2020, negative,  recommend restart age 47 Mammogram: never,   Last eye: denies concerns, never Last dental: goes q43m, last 2023 Last derm: plans to schedule    Allergies:  Allergies  Allergen Reactions  . Augmentin [Amoxicillin-Pot Clavulanate] Rash  . Percocet [Oxycodone-Acetaminophen] Rash    Medical History:  has Family history of celiac disease; Iron deficiency anemia; BMI 25.0-25.9,adult; History of menorrhagia; Vitamin D deficiency; Elevated LFTs; Migraine without aura and without status migrainosus, not intractable; and Sebaceous cyst on their problem list. Surgical History:  She  has a past surgical history that includes lapidus bunionectomy (Right, 08/19/2017) and Wisdom tooth extraction (2020). Family  History:  Herfamily history includes Alcoholism in her father, maternal grandfather, and paternal grandfather; Celiac disease in her mother; Heart disease in her maternal grandfather and maternal grandmother. Social History:   reports that she has never smoked. She has never used smokeless tobacco. She reports that she does not currently use alcohol. She reports that she does not use drugs.   Review of Systems:  Review of Systems  Constitutional:  Negative for malaise/fatigue and weight loss.  HENT:  Negative for hearing loss and tinnitus.   Eyes:  Negative for blurred vision and double vision.  Respiratory:  Negative for cough, shortness of breath and wheezing.   Cardiovascular:  Negative for chest pain, palpitations, orthopnea, claudication and leg swelling.  Gastrointestinal:  Negative for abdominal pain, blood in stool, constipation, diarrhea, heartburn, melena, nausea and vomiting.  Genitourinary: Negative.   Musculoskeletal:  Negative for joint pain and myalgias.  Skin:  Negative for rash.  Neurological:  Positive for headaches (headaches twice a week, rare migraines with photosensitivity). Negative for dizziness, tingling, sensory change and weakness.  Endo/Heme/Allergies:  Negative for polydipsia.  Psychiatric/Behavioral: Negative.    All other systems reviewed and are negative.   Physical Exam: BP 110/80   Pulse 66   Temp 98.3 F (36.8 C)   Ht 5' 3.25" (1.607 m)   Wt 166 lb 9.6 oz (75.6 kg)   SpO2 98%   BMI 29.28 kg/m  Wt Readings from Last 3 Encounters:  03/10/23 166 lb 9.6 oz (75.6 kg)  01/14/22 145 lb (65.8 kg) (75%, Z= 0.67)*  12/17/21 150 lb 9.6 oz (68.3 kg) (81%, Z= 0.86)*   * Growth percentiles are based on CDC (Girls, 2-20 Years) data.   General Appearance: Well nourished, in no apparent distress. Eyes: PERRLA, EOMs, conjunctiva no swelling or erythema Sinuses: No Frontal/maxillary tenderness ENT/Mouth: Ext aud canals clear, TMs without erythema, bulging.  No erythema, swelling, or exudate on post pharynx.  Tonsils not swollen or erythematous. Hearing normal.  Neck: Supple, thyroid normal.  Respiratory: Respiratory effort normal, BS equal bilaterally without rales, rhonchi, wheezing or stridor.  Cardio: RRR with no MRGs. Brisk peripheral pulses without edema.  Abdomen: Soft, + BS.  Non tender, no guarding, rebound, hernias, masses. Lymphatics: Non tender without lymphadenopathy.  Musculoskeletal: Full ROM, 5/5 strength, Normal gait Skin: Warm, dry without rashes, lesions, ecchymosis. Dermal cyst to chin.  Neuro: Cranial nerves intact. No cerebellar symptoms.  Psych: Awake and oriented X 3, normal affect, Insight and Judgment appropriate.  GU: defer Breasts: discussed self exams, declines manual exam today, GYN follows  EKG: defer  Adela Glimpse, NP 2:09 PM [facility]

## 2023-03-11 ENCOUNTER — Encounter: Payer: Self-pay | Admitting: Nurse Practitioner

## 2023-03-11 NOTE — Patient Instructions (Signed)

## 2023-03-12 DIAGNOSIS — L700 Acne vulgaris: Secondary | ICD-10-CM | POA: Diagnosis not present

## 2023-03-12 DIAGNOSIS — Q825 Congenital non-neoplastic nevus: Secondary | ICD-10-CM | POA: Diagnosis not present

## 2023-03-12 DIAGNOSIS — D2271 Melanocytic nevi of right lower limb, including hip: Secondary | ICD-10-CM | POA: Diagnosis not present

## 2023-03-12 DIAGNOSIS — D224 Melanocytic nevi of scalp and neck: Secondary | ICD-10-CM | POA: Diagnosis not present

## 2023-03-12 LAB — CBC WITH DIFFERENTIAL/PLATELET
Absolute Monocytes: 360 cells/uL (ref 200–950)
Basophils Absolute: 41 cells/uL (ref 0–200)
Basophils Relative: 0.7 %
Eosinophils Absolute: 47 cells/uL (ref 15–500)
Eosinophils Relative: 0.8 %
HCT: 40.5 % (ref 35.0–45.0)
Hemoglobin: 13.2 g/dL (ref 11.7–15.5)
Lymphs Abs: 1333 cells/uL (ref 850–3900)
MCH: 29 pg (ref 27.0–33.0)
MCHC: 32.6 g/dL (ref 32.0–36.0)
MCV: 89 fL (ref 80.0–100.0)
MPV: 11.4 fL (ref 7.5–12.5)
Monocytes Relative: 6.1 %
Neutro Abs: 4118 cells/uL (ref 1500–7800)
Neutrophils Relative %: 69.8 %
Platelets: 318 10*3/uL (ref 140–400)
RBC: 4.55 10*6/uL (ref 3.80–5.10)
RDW: 12 % (ref 11.0–15.0)
Total Lymphocyte: 22.6 %
WBC: 5.9 10*3/uL (ref 3.8–10.8)

## 2023-03-12 LAB — QUANTIFERON-TB GOLD PLUS
Mitogen-NIL: 7.7 IU/mL
NIL: 0.03 IU/mL
QuantiFERON-TB Gold Plus: NEGATIVE
TB1-NIL: 0 IU/mL
TB2-NIL: 0 IU/mL

## 2023-03-12 LAB — LIPID PANEL
Cholesterol: 190 mg/dL (ref <200)
HDL: 43 mg/dL — ABNORMAL LOW (ref >=50)
LDL Cholesterol (Calc): 126 mg/dL (calc) — ABNORMAL HIGH
Non-HDL Cholesterol (Calc): 147 mg/dL (calc) — ABNORMAL HIGH (ref <130)
Total CHOL/HDL Ratio: 4.4 (calc) (ref <5.0)
Triglycerides: 104 mg/dL (ref <150)

## 2023-03-12 LAB — COMPLETE METABOLIC PANEL WITH GFR
AG Ratio: 1.7 (calc) (ref 1.0–2.5)
ALT: 12 U/L (ref 6–29)
AST: 16 U/L (ref 10–30)
Albumin: 4.8 g/dL (ref 3.6–5.1)
Alkaline phosphatase (APISO): 53 U/L (ref 31–125)
BUN: 13 mg/dL (ref 7–25)
CO2: 28 mmol/L (ref 20–32)
Calcium: 10.3 mg/dL — ABNORMAL HIGH (ref 8.6–10.2)
Chloride: 104 mmol/L (ref 98–110)
Creat: 0.68 mg/dL (ref 0.50–0.96)
Globulin: 2.8 g/dL (calc) (ref 1.9–3.7)
Glucose, Bld: 82 mg/dL (ref 65–99)
Potassium: 4.8 mmol/L (ref 3.5–5.3)
Sodium: 138 mmol/L (ref 135–146)
Total Bilirubin: 0.9 mg/dL (ref 0.2–1.2)
Total Protein: 7.6 g/dL (ref 6.1–8.1)
eGFR: 127 mL/min/{1.73_m2} (ref >=60)

## 2023-03-12 LAB — HEMOGLOBIN A1C W/OUT EAG: Hgb A1c MFr Bld: 5.1 % of total Hgb (ref <5.7)

## 2023-03-12 LAB — VITAMIN D 25 HYDROXY (VIT D DEFICIENCY, FRACTURES): Vit D, 25-Hydroxy: 40 ng/mL (ref 30–100)

## 2023-03-12 LAB — IRON,TIBC AND FERRITIN PANEL
%SAT: 26 % (calc) (ref 16–45)
Ferritin: 27 ng/mL (ref 16–154)
Iron: 76 ug/dL (ref 40–190)
TIBC: 296 mcg/dL (calc) (ref 250–450)

## 2023-03-12 LAB — INSULIN, RANDOM: Insulin: 19.1 u[IU]/mL — ABNORMAL HIGH

## 2023-03-12 LAB — TSH: TSH: 2.24 mIU/L

## 2023-03-19 ENCOUNTER — Encounter: Payer: Self-pay | Admitting: Nurse Practitioner

## 2023-03-19 DIAGNOSIS — Z1159 Encounter for screening for other viral diseases: Secondary | ICD-10-CM

## 2023-03-24 ENCOUNTER — Other Ambulatory Visit: Payer: BC Managed Care – PPO

## 2023-03-24 DIAGNOSIS — Z1159 Encounter for screening for other viral diseases: Secondary | ICD-10-CM | POA: Diagnosis not present

## 2023-06-02 ENCOUNTER — Ambulatory Visit: Payer: BC Managed Care – PPO | Admitting: Nurse Practitioner

## 2023-06-02 DIAGNOSIS — Z0001 Encounter for general adult medical examination with abnormal findings: Secondary | ICD-10-CM | POA: Diagnosis not present

## 2023-06-02 DIAGNOSIS — Z1159 Encounter for screening for other viral diseases: Secondary | ICD-10-CM | POA: Diagnosis not present

## 2023-06-03 LAB — REFLEX TIQ

## 2023-06-03 LAB — ACUTE HEP PANEL AND HEP B SURFACE AB
HEPATITIS C ANTIBODY REFILL$(REFL): NONREACTIVE
Hep A IgM: NONREACTIVE
Hep B C IgM: NONREACTIVE
Hepatitis B Surface Ag: NONREACTIVE

## 2023-06-09 ENCOUNTER — Encounter: Payer: Self-pay | Admitting: Nurse Practitioner

## 2023-06-09 DIAGNOSIS — Z1159 Encounter for screening for other viral diseases: Secondary | ICD-10-CM

## 2023-06-11 NOTE — Progress Notes (Signed)
---->  CHANGED TO A NURSE VISIT ---->   PATIENT NOT SEEN BY PROVIDER

## 2023-07-21 ENCOUNTER — Ambulatory Visit: Payer: BC Managed Care – PPO

## 2023-07-21 ENCOUNTER — Other Ambulatory Visit: Payer: Self-pay

## 2023-07-21 DIAGNOSIS — Z1159 Encounter for screening for other viral diseases: Secondary | ICD-10-CM

## 2023-07-22 LAB — HEPATITIS B SURFACE ANTIBODY,QUALITATIVE: Hep B S Ab: REACTIVE — AB

## 2023-10-15 DIAGNOSIS — S93491A Sprain of other ligament of right ankle, initial encounter: Secondary | ICD-10-CM | POA: Diagnosis not present

## 2024-01-29 ENCOUNTER — Encounter: Payer: Self-pay | Admitting: Family Medicine

## 2024-01-29 ENCOUNTER — Ambulatory Visit: Admitting: Family Medicine

## 2024-01-29 VITALS — BP 111/72 | HR 58 | Temp 98.2°F | Resp 18 | Ht 63.25 in | Wt 157.0 lb

## 2024-01-29 DIAGNOSIS — Z0000 Encounter for general adult medical examination without abnormal findings: Secondary | ICD-10-CM

## 2024-01-29 DIAGNOSIS — D508 Other iron deficiency anemias: Secondary | ICD-10-CM

## 2024-01-29 DIAGNOSIS — E559 Vitamin D deficiency, unspecified: Secondary | ICD-10-CM | POA: Diagnosis not present

## 2024-01-29 DIAGNOSIS — Z111 Encounter for screening for respiratory tuberculosis: Secondary | ICD-10-CM | POA: Diagnosis not present

## 2024-01-29 DIAGNOSIS — G43009 Migraine without aura, not intractable, without status migrainosus: Secondary | ICD-10-CM

## 2024-01-29 DIAGNOSIS — N946 Dysmenorrhea, unspecified: Secondary | ICD-10-CM

## 2024-01-29 DIAGNOSIS — D649 Anemia, unspecified: Secondary | ICD-10-CM | POA: Diagnosis not present

## 2024-01-29 NOTE — Progress Notes (Signed)
 New Patient Office Visit  Subjective:  Patient ID: Jody Wyatt, female    DOB: 2001-07-27  Age: 23 y.o. MRN: [id number]  CC:  Chief Complaint  Patient presents with   Establish Care    Initial visit to establish care with new pcp Never had pap done    HPI Jody Wyatt presents for new pt Dr. Cassondra Cliff Discussed the use of AI scribe software for clinical note transcription with the patient, who gave verbal consent to proceed.  History of Present Illness Jody Wyatt is a 23 year old female who presents for an annual physical exam.  She experiences migraines approximately three times a year, with the last episode occurring in February. This episode was notable for the presence of an aura for the first time, described as 'black spots' and 'weirdly dim and blurry' vision in the lower corners of her eyes. She uses Imitrex  (sumatriptan ) as needed but prefers ibuprofen due to side effects from Imitrex . She also experiences tension headaches a couple of times a month, often originating from her neck. May be hormonal as well  She experiences severe menstrual cramps that can be debilitating, causing nausea and a sensation of near syncope. The pain is primarily in her back and is relieved by applying pressure. She takes 800 mg of ibuprofen to manage the pain, sometimes preemptively before her period starts, but finds it only partially effective.  She has a history of iron deficiency anemia and vitamin D  deficiency. She recently restarted iron supplementation three days ago after experiencing heart palpitations, which have since improved. She is interested in checking her iron levels as she has experienced palpitations that sometimes cause her to feel anxious and forget to breathe.  She has a family history of heart disease on her mother's side and alcoholism on both sides of her family. Her mother has celiac disease, and her father is a recovered alcoholic. 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 She rarely drinks, has never smoked, and does not use drugs.  Needs quant TB test for school  No double vision, blurry vision, sore throat, hoarseness, seasonal allergies, chest pains, coughing, wheezing, shortness of breath, asthma, vomiting, diarrhea, constipation, blood in stools, trouble urinating, muscle aches, joint pains, arthritis, depression, or suicidal thoughts.     Current Outpatient Medications:    Ferrous Sulfate (IRON PO), Take by mouth daily., Disp: , Rfl:    ibuprofen (ADVIL,MOTRIN) 200 MG tablet, Take 400 mg by mouth every 6 (six) hours as needed (foot pain)., Disp: , Rfl:    MAGNESIUM PO, Take by mouth., Disp: , Rfl:    tretinoin (RETIN-A) 0.1 % cream, 1 application in the evening to face Externally Once a day, PM pea size amount; Duration: 30 days, Disp: , Rfl:    SUMAtriptan  (IMITREX ) 50 MG tablet, TAKE 1 TAB BY MOUTH AS NEEDED FOR MIGRAINE, MAY REPEAT IN 2 HOURS IF PERSISTS (Patient not taking: Reported on 03/10/2023), Disp: 10 tablet, Rfl: 0  Past Medical History:  Diagnosis Date   Allergy    Anemia    Menorrhagia    Migraine aura occurring with and without headache     Past Surgical History:  Procedure Laterality Date   lapidus bunionectomy Bilateral 08/19/2017   WISDOM TOOTH EXTRACTION  2020    Family History  Problem Relation Age of Onset   Hyperlipidemia Mother    Celiac disease Mother    Hyperlipidemia Father    Alcoholism Father  Depression Sister    Heart disease Maternal Grandmother    Cancer Maternal Grandfather    Alcohol abuse Maternal Grandfather    Heart disease Maternal Grandfather    Alcoholism Maternal Grandfather    Alcoholism Paternal Grandfather    Alcohol abuse Other     Social History   Socioeconomic History   Marital status: Single    Spouse name: Not on file   Number of children: Not on file   Years of education: Not on file   Highest education level: Not on  file  Occupational History   Not on file  Tobacco Use   Smoking status: Never   Smokeless tobacco: Never  Vaping Use   Vaping status: Never Used  Substance and Sexual Activity   Alcohol use: Yes    Comment: 2 times per month   Drug use: Never   Sexual activity: Not Currently    Partners: Male    Birth control/protection: None  Other Topics Concern   Not on file  Social History Narrative   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 [employer]   Social Drivers of Health   Financial Resource Strain: Not on file  Food Insecurity: Not on file  Transportation Needs: Not on file  Physical Activity: Not on file  Stress: Not on file  Social Connections: Not on file  Intimate Partner Violence: Not on file    ROS  ROS: Gen: no fever, chills  Skin: no rash, itching ENT: no ear pain, ear drainage, nasal congestion, rhinorrhea, sinus pressure, sore throat Eyes: no blurry vision, double vision Resp: no cough, wheeze,SOB CV: no CP, LE edema,  GI: no heartburn, n/v/d/c, abd pain GU: no dysuria, urgency, frequency, hematuria MSK: no joint pain, myalgias, back pain Neuro: no dizziness, , weakness, vertigo Psych: no depression, anxiety, insomnia, SI   Objective:   Today's Vitals: BP 111/72   Pulse (!) 58   Temp 98.2 F (36.8 C) (Temporal)   Resp 18   Ht 5' 3.25" (1.607 m)   Wt 157 lb (71.2 kg)   LMP 01/16/2024 (Exact Date)   SpO2 99%   BMI 27.59 kg/m   Physical Exam  Gen: WDWN NAD HEENT: NCAT, conjunctiva not injected, sclera nonicteric TM WNL B, OP moist, no exudates  NECK:  supple, no thyromegaly, no nodes, no carotid bruits CARDIAC: RRR, S1S2+, no murmur. DP 2+B LUNGS: CTAB. No wheezes ABDOMEN:  BS+, soft, NTND, No HSM, no masses EXT:  no edema MSK: no gross abnormalities.  NEURO: A&O x3.  CN II-XII intact.  PSYCH: normal mood. Good eye contact   Assessment & Plan:  Wellness examination -     QuantiFERON-TB Gold Plus -     CBC with Differential/Platelet -     COMPLETE METABOLIC PANEL  WITHOUT GFR -     Iron, TIBC and Ferritin Panel -     Vitamin B12 -     VITAMIN D  25 Hydroxy (Vit-D Deficiency, Fractures) -     TSH -     Lipid panel -     Hemoglobin A1c  Dysmenorrhea  Screening-pulmonary TB -     QuantiFERON-TB Gold Plus  Other iron deficiency anemia -     CBC with Differential/Platelet -     Iron, TIBC and Ferritin Panel -     Vitamin B12  Vitamin D  deficiency -     VITAMIN D  25 Hydroxy (Vit-D Deficiency, Fractures)  Migraine without aura and without status migrainosus, not intractable   Wellness-anticipatory guidance.  Work on  Diet/Exercise  Check CBC,CMP,lipids,TSH, A1C.  F/u 1 yr  Assessment and Plan Assessment & Plan Migraine with aura   She experiences migraines with aura about three times a year, with the last episode in February. The aura includes scotomas and dim, blurry vision. Imitrex  (sumatriptan ) is used as needed but causes discomfort, so it is rarely taken. She is educated on the increased risk of stroke with migraines with aura and advised against using combined oral contraceptives. Alternative contraceptive methods such as the mini pill, IUD, Nexplanon, or Depo shot are recommended. Continue using Imitrex  as needed for migraines. Avoid combined oral contraceptives. Consider alternative contraceptive methods. Educate on migraine management and stroke risk.  Tension-type headaches   She experiences tension-type headaches a couple of times a month, often originating from the neck, and these are more frequent than migraines. Advised to stretch the neck, stay hydrated, and exercise regularly to help prevent headaches.   Dysmenorrhea   She experiences severe menstrual cramps, particularly in the back, causing nausea and near syncope. Symptoms last about half a day. Ibuprofen is taken preemptively, but effectiveness varies. Advised to start ibuprofen 2-3 days before the expected period to reduce prostaglandin release. Discussed the potential use of  stronger NSAIDs like meloxicam or Celebrex if symptoms persist. Start ibuprofen 2-3 days before the expected period. Consider supplementing with acetaminophen . Evaluate the need for stronger NSAIDs if symptoms persist.  Iron deficiency   She reports heart palpitations that have improved since restarting iron supplementation three days ago. She has anemia and vitamin D  deficiency. Iron levels will be assessed to determine current status. Discussed the potential for iron deficiency to cause palpitations and the importance of monitoring iron levels. Order iron studies including ferritin and hemoglobin. Continue iron supplementation.  General Health Maintenance   Advised to use condoms if sexually active and to avoid combined oral contraceptives due to migraine with aura. Consider a Pap smear given her age and past sexual activity. Advise on safe sex practices. Consider Pap smear given age and sexual history.  Follow-up   Advised to return for annual physicals or as needed. Needs QuantiFERON Gold test for school requirements and cholesterol panel. Schedule annual physical. Follow up as needed for migraines or dysmenorrhea. Order [employer] Guys test for school requirements. Order cholesterol panel.    Follow-up: Return in about 1 year (around 01/28/2025) for annual physical.   Ellsworth Haas, MD

## 2024-01-29 NOTE — Patient Instructions (Signed)
 Welcome to [facility]! It was a pleasure meeting you today.  As discussed, Please schedule a 12 month follow up visit today.  Magnesium at night  PLEASE NOTE:  If you had any LAB tests please let us  know if you have not heard back within a few days. You may see your results on MyChart before we have a chance to review them but we will give you a call once they are reviewed by us . If we ordered any REFERRALS today, please let us  know if you have not heard from their office within the next week.  Let us  know through MyChart if you are needing REFILLS, or have your pharmacy send us  the request. You can also use MyChart to communicate with me or any office staff.  Please try these tips to maintain a healthy lifestyle:  Eat most of your calories during the day when you are active. Eliminate processed foods including packaged sweets (pies, cakes, cookies), reduce intake of potatoes, white bread, white pasta, and white rice. Look for whole grain options, oat flour or almond flour.  Each meal should contain half fruits/vegetables, one quarter protein, and one quarter carbs (no bigger than a computer mouse).  Cut down on sweet beverages. This includes juice, soda, and sweet tea. Also watch fruit intake, though this is a healthier sweet option, it still contains natural sugar! Limit to 3 servings daily.  Drink at least 1 glass of water with each meal and aim for at least 8 glasses per day  Exercise at least 150 minutes every week.

## 2024-02-02 ENCOUNTER — Ambulatory Visit: Payer: Self-pay | Admitting: Family Medicine

## 2024-02-02 LAB — COMPLETE METABOLIC PANEL WITHOUT GFR
AG Ratio: 1.7 (calc) (ref 1.0–2.5)
ALT: 7 U/L (ref 6–29)
AST: 13 U/L (ref 10–30)
Albumin: 4.7 g/dL (ref 3.6–5.1)
Alkaline phosphatase (APISO): 46 U/L (ref 31–125)
BUN: 11 mg/dL (ref 7–25)
CO2: 24 mmol/L (ref 20–32)
Calcium: 10 mg/dL (ref 8.6–10.2)
Chloride: 103 mmol/L (ref 98–110)
Creat: 0.62 mg/dL (ref 0.50–0.96)
Globulin: 2.7 g/dL (ref 1.9–3.7)
Glucose, Bld: 83 mg/dL (ref 65–99)
Potassium: 4.1 mmol/L (ref 3.5–5.3)
Sodium: 138 mmol/L (ref 135–146)
Total Bilirubin: 0.8 mg/dL (ref 0.2–1.2)
Total Protein: 7.4 g/dL (ref 6.1–8.1)

## 2024-02-02 LAB — LIPID PANEL
Cholesterol: 185 mg/dL (ref <200)
HDL: 47 mg/dL — ABNORMAL LOW (ref >=50)
LDL Cholesterol (Calc): 123 mg/dL — ABNORMAL HIGH
Non-HDL Cholesterol (Calc): 138 mg/dL — ABNORMAL HIGH (ref <130)
Total CHOL/HDL Ratio: 3.9 (calc) (ref <5.0)
Triglycerides: 61 mg/dL (ref <150)

## 2024-02-02 LAB — QUANTIFERON-TB GOLD PLUS
Mitogen-NIL: 7.35 [IU]/mL
NIL: 0.02 [IU]/mL
QuantiFERON-TB Gold Plus: NEGATIVE
TB1-NIL: 0 [IU]/mL
TB2-NIL: 0 [IU]/mL

## 2024-02-02 LAB — TSH: TSH: 1.59 m[IU]/L

## 2024-02-02 LAB — IRON,TIBC AND FERRITIN PANEL
%SAT: 15 % — ABNORMAL LOW (ref 16–45)
Ferritin: 31 ng/mL (ref 16–154)
Iron: 43 ug/dL (ref 40–190)
TIBC: 288 ug/dL (ref 250–450)

## 2024-02-02 LAB — CBC WITH DIFFERENTIAL/PLATELET
Absolute Lymphocytes: 1559 {cells}/uL (ref 850–3900)
Absolute Monocytes: 338 {cells}/uL (ref 200–950)
Basophils Absolute: 28 {cells}/uL (ref 0–200)
Basophils Relative: 0.4 %
Eosinophils Absolute: 48 {cells}/uL (ref 15–500)
Eosinophils Relative: 0.7 %
HCT: 40.7 % (ref 35.0–45.0)
Hemoglobin: 12.8 g/dL (ref 11.7–15.5)
MCH: 28.4 pg (ref 27.0–33.0)
MCHC: 31.4 g/dL — ABNORMAL LOW (ref 32.0–36.0)
MCV: 90.4 fL (ref 80.0–100.0)
MPV: 11.5 fL (ref 7.5–12.5)
Monocytes Relative: 4.9 %
Neutro Abs: 4927 {cells}/uL (ref 1500–7800)
Neutrophils Relative %: 71.4 %
Platelets: 342 10*3/uL (ref 140–400)
RBC: 4.5 10*6/uL (ref 3.80–5.10)
RDW: 12.2 % (ref 11.0–15.0)
Total Lymphocyte: 22.6 %
WBC: 6.9 10*3/uL (ref 3.8–10.8)

## 2024-02-02 LAB — HEMOGLOBIN A1C
Hgb A1c MFr Bld: 5.1 % (ref <5.7)
Mean Plasma Glucose: 100 mg/dL
eAG (mmol/L): 5.5 mmol/L

## 2024-02-02 LAB — VITAMIN B12: Vitamin B-12: 282 pg/mL (ref 200–1100)

## 2024-02-02 LAB — VITAMIN D 25 HYDROXY (VIT D DEFICIENCY, FRACTURES): Vit D, 25-Hydroxy: 29 ng/mL — ABNORMAL LOW (ref 30–100)

## 2024-02-02 NOTE — Progress Notes (Signed)
 Tb negative.  Labs look good.  Work on diet/exercise for cholesterol-still accepable Iron, B12 and D slightly low-take vitamin with iron.  If already doing, then add B complex and 1000iu/d of d

## 2024-03-07 DIAGNOSIS — L700 Acne vulgaris: Secondary | ICD-10-CM | POA: Diagnosis not present

## 2024-03-07 DIAGNOSIS — L814 Other melanin hyperpigmentation: Secondary | ICD-10-CM | POA: Diagnosis not present

## 2024-03-07 DIAGNOSIS — D225 Melanocytic nevi of trunk: Secondary | ICD-10-CM | POA: Diagnosis not present

## 2024-03-09 ENCOUNTER — Encounter: Payer: BC Managed Care – PPO | Admitting: Nurse Practitioner

## 2024-06-06 DIAGNOSIS — L700 Acne vulgaris: Secondary | ICD-10-CM | POA: Diagnosis not present

## 2024-08-23 DIAGNOSIS — L719 Rosacea, unspecified: Secondary | ICD-10-CM | POA: Diagnosis not present

## 2024-08-23 DIAGNOSIS — L700 Acne vulgaris: Secondary | ICD-10-CM | POA: Diagnosis not present

## 2024-08-23 DIAGNOSIS — L708 Other acne: Secondary | ICD-10-CM | POA: Diagnosis not present

## 2025-02-02 ENCOUNTER — Encounter: Admitting: Family Medicine
